# Patient Record
Sex: Female | Born: 2000 | Race: White | Hispanic: No | Marital: Single | Smoking: Never smoker
Health system: Southern US, Community
[De-identification: ages and names within clinical notes are randomized; demographics above are authoritative.]

---
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
## 2019-07-07 ENCOUNTER — Ambulatory Visit: Payer: Self-pay | Attending: Internal Medicine

## 2019-07-07 DIAGNOSIS — Z23 Encounter for immunization: Secondary | ICD-10-CM

## 2019-07-07 NOTE — Progress Notes (Signed)
   Covid-19 Vaccination Clinic  Name:  Monica Dodson    MRN: [id number] DOB: 2000-10-13  07/07/2019  [name] was observed post Covid-19 immunization for 15 minutes without incident. She was provided with Vaccine Information Sheet and instruction to access the V-Safe system.   [name] was instructed to call 911 with any severe reactions post vaccine: Marland Kitchen Difficulty breathing  . Swelling of face and throat  . A fast heartbeat  . A bad rash all over body  . Dizziness and weakness   Immunizations Administered    Name Date Dose VIS Date Route   Pfizer COVID-19 Vaccine 07/07/2019 11:41 AM 0.3 mL 04/06/2019 Intramuscular   Manufacturer: [employer], [employer]   Lot: [id number]   NDC: [address]-5

## 2019-08-01 ENCOUNTER — Ambulatory Visit: Payer: Self-pay | Attending: Internal Medicine

## 2019-08-01 DIAGNOSIS — Z23 Encounter for immunization: Secondary | ICD-10-CM

## 2019-08-01 NOTE — Progress Notes (Signed)
   Covid-19 Vaccination Clinic  Name:  LARESHA BACORN    MRN: [id number] DOB: [birth year]  08/01/2019  Ms. Gregg was observed post Covid-19 immunization for 15 minutes without incident. She was provided with Vaccine Information Sheet and instruction to access the V-Safe system.   Ms. Gorelick was instructed to call 911 with any severe reactions post vaccine: Marland Kitchen Difficulty breathing  . Swelling of face and throat  . A fast heartbeat  . A bad rash all over body  . Dizziness and weakness   Immunizations Administered    Name Date Dose VIS Date Route   Pfizer COVID-19 Vaccine 08/01/2019 10:41 AM 0.3 mL 04/06/2019 Intramuscular   Manufacturer: [employer], [employer]   Lot: [id number]   NDC: [address]-5

## 2023-08-22 ENCOUNTER — Ambulatory Visit: Payer: Self-pay | Admitting: Internal Medicine

## 2023-08-22 VITALS — BP 112/68 | Ht 65.0 in | Wt 143.6 lb

## 2023-08-22 DIAGNOSIS — Z1159 Encounter for screening for other viral diseases: Secondary | ICD-10-CM | POA: Diagnosis not present

## 2023-08-22 DIAGNOSIS — Z136 Encounter for screening for cardiovascular disorders: Secondary | ICD-10-CM

## 2023-08-22 DIAGNOSIS — R739 Hyperglycemia, unspecified: Secondary | ICD-10-CM

## 2023-08-22 DIAGNOSIS — Z0000 Encounter for general adult medical examination without abnormal findings: Secondary | ICD-10-CM | POA: Diagnosis not present

## 2023-08-22 DIAGNOSIS — Z114 Encounter for screening for human immunodeficiency virus [HIV]: Secondary | ICD-10-CM

## 2023-08-22 NOTE — Patient Instructions (Signed)

## 2023-08-22 NOTE — Progress Notes (Signed)
 Subjective:    Patient ID: Monica Dodson, female    DOB: 2000/10/13, 23 y.o.   MRN: [id number]  HPI  Patient presents to clinic today to establish care.  She would like her annual exam today.  Flu: 2023 Tetanus: > 10 years ago Covid: x 2  Pap smear: never Dentist: as needed  Diet: She does eat meat. She consumes fruits and veggies. She does eat fried foods. She drinks mostly water, coffee. Exercise: Walking  Review of Systems   History reviewed. No pertinent past medical history.  No current outpatient medications on file.   No current facility-administered medications for this visit.    No Known Allergies  History reviewed. No pertinent family history.  Social History   Socioeconomic History   Marital status: Single    Spouse name: Not on file   Number of children: Not on file   Years of education: Not on file   Highest education level: Bachelor's degree (e.g., BA, AB, BS)  Occupational History   Not on file  Tobacco Use   Smoking status: Never   Smokeless tobacco: Never  Vaping Use   Vaping status: Never Used  Substance and Sexual Activity   Alcohol use: Yes    Comment: social drinker   Drug use: Not Currently    Types: Marijuana   Sexual activity: Yes    Birth control/protection: Condom  Other Topics Concern   Not on file  Social History Narrative   Not on file   Social Drivers of Health   Financial Resource Strain: Low Risk  (08/22/2023)   Overall Financial Resource Strain (CARDIA)    Difficulty of Paying Living Expenses: Not very hard  Food Insecurity: No Food Insecurity (08/22/2023)   Hunger Vital Sign    Worried About Running Out of Food in the Last Year: Never true    Ran Out of Food in the Last Year: Never true  Transportation Needs: No Transportation Needs (08/22/2023)   PRAPARE - Administrator, Civil Service (Medical): No    Lack of Transportation (Non-Medical): No  Physical Activity: Sufficiently Active (08/22/2023)    Exercise Vital Sign    Days of Exercise per Week: 7 days    Minutes of Exercise per Session: 70 min  Stress: Stress Concern Present (08/22/2023)   [employer] of Occupational Health - Occupational Stress Questionnaire    Feeling of Stress : To some extent  Social Connections: Moderately Isolated (08/22/2023)   Social Connection and Isolation Panel [NHANES]    Frequency of Communication with Friends and Family: Twice a week    Frequency of Social Gatherings with Friends and Family: Twice a week    Attends Religious Services: Never    Database administrator or Organizations: Yes    Attends Engineer, structural: More than 4 times per year    Marital Status: Never married  Intimate Partner Violence: Not on file     Constitutional: Denies fever, malaise, fatigue, headache or abrupt weight changes.  HEENT: Denies eye pain, eye redness, ear pain, ringing in the ears, wax buildup, runny nose, nasal congestion, bloody nose, or sore throat. Respiratory: Denies difficulty breathing, shortness of breath, cough or sputum production.   Cardiovascular: Denies chest pain, chest tightness, palpitations or swelling in the hands or feet.  Gastrointestinal: Denies abdominal pain, bloating, constipation, diarrhea or blood in the stool.  GU: Denies urgency, frequency, pain with urination, burning sensation, blood in urine, odor or discharge. Musculoskeletal: Denies decrease  in range of motion, difficulty with gait, muscle pain or joint pain and swelling.  Skin: Denies redness, rashes, lesions or ulcercations.  Neurological: Denies dizziness, difficulty with memory, difficulty with speech or problems with balance and coordination.  Psych: Denies anxiety, depression, SI/HI.  No other specific complaints in a complete review of systems (except as listed in HPI above).      Objective:   Physical Exam  BP 112/68 (BP Location: Left Arm, Patient Position: Sitting, Cuff Size: Normal)   Ht 5\' 5"   (1.651 m)   Wt 143 lb 9.6 oz (65.1 kg)   LMP 08/22/2023 (Exact Date)   BMI 23.90 kg/m  Wt Readings from Last 3 Encounters:  08/22/23 143 lb 9.6 oz (65.1 kg)    General: Appears her stated age, well developed, well nourished in NAD. Skin: Warm, dry and intact. No rashes, lesions or ulcerations noted. HEENT: Head: normal shape and size; Eyes: sclera white, no icterus, conjunctiva pink, PERRLA and EOMs intact;  Neck:  Neck supple, trachea midline. No masses, lumps or thyromegaly present.  Cardiovascular: Normal rate and rhythm. S1,S2 noted.  No murmur, rubs or gallops noted. No JVD or BLE edema. Pulmonary/Chest: Normal effort and positive vesicular breath sounds. No respiratory distress. No wheezes, rales or ronchi noted.  Abdomen: Soft and nontender. Normal bowel sounds.  Musculoskeletal: Strength 5/5 BUE/BLE.  No difficulty with gait.  Neurological: Alert and oriented. Cranial nerves II-XII grossly intact. Coordination normal.  Psychiatric: Mood and affect normal. Behavior is normal. Judgment and thought content normal.        Assessment & Plan:  Preventative health maintenance:  Encouraged her to get a flu shot in the fall We will give her her tetanus vaccine at her appointment for her Pap smear Encouraged her to get her COVID booster She declines Pap smear today as she is currently on her menstrual cycle Encouraged her to consume a balanced diet and exercise regimen Advised her to see an eye doctor and dentist annually We will check CBC, c-Met, lipid, A1c, HIV and hep C today  Schedule an appointment for your Pap smear Helayne Lo, NP

## 2023-08-23 ENCOUNTER — Encounter: Payer: Self-pay | Admitting: Internal Medicine

## 2023-08-23 LAB — CBC
HCT: 37.6 % (ref 35.0–45.0)
Hemoglobin: 12.3 g/dL (ref 11.7–15.5)
MCH: 29.9 pg (ref 27.0–33.0)
MCHC: 32.7 g/dL (ref 32.0–36.0)
MCV: 91.5 fL (ref 80.0–100.0)
MPV: 11.4 fL (ref 7.5–12.5)
Platelets: 295 10*3/uL (ref 140–400)
RBC: 4.11 10*6/uL (ref 3.80–5.10)
RDW: 11.8 % (ref 11.0–15.0)
WBC: 5.1 10*3/uL (ref 3.8–10.8)

## 2023-08-23 LAB — LIPID PANEL
Cholesterol: 207 mg/dL — ABNORMAL HIGH (ref <200)
HDL: 83 mg/dL (ref >=50)
LDL Cholesterol (Calc): 110 mg/dL — ABNORMAL HIGH
Non-HDL Cholesterol (Calc): 124 mg/dL (ref <130)
Total CHOL/HDL Ratio: 2.5 (calc) (ref <5.0)
Triglycerides: 48 mg/dL (ref <150)

## 2023-08-23 LAB — COMPREHENSIVE METABOLIC PANEL WITH GFR
AG Ratio: 1.7 (calc) (ref 1.0–2.5)
ALT: 25 U/L (ref 6–29)
AST: 27 U/L (ref 10–30)
Albumin: 4.3 g/dL (ref 3.6–5.1)
Alkaline phosphatase (APISO): 49 U/L (ref 31–125)
BUN: 15 mg/dL (ref 7–25)
CO2: 30 mmol/L (ref 20–32)
Calcium: 9.2 mg/dL (ref 8.6–10.2)
Chloride: 104 mmol/L (ref 98–110)
Creat: 0.69 mg/dL (ref 0.50–0.96)
Globulin: 2.6 g/dL (ref 1.9–3.7)
Glucose, Bld: 80 mg/dL (ref 65–99)
Potassium: 4.4 mmol/L (ref 3.5–5.3)
Sodium: 140 mmol/L (ref 135–146)
Total Bilirubin: 0.4 mg/dL (ref 0.2–1.2)
Total Protein: 6.9 g/dL (ref 6.1–8.1)
eGFR: 125 mL/min/{1.73_m2} (ref >=60)

## 2023-08-23 LAB — HEMOGLOBIN A1C
Hgb A1c MFr Bld: 5.4 % (ref <5.7)
Mean Plasma Glucose: 108 mg/dL
eAG (mmol/L): 6 mmol/L

## 2023-08-23 LAB — HIV ANTIBODY (ROUTINE TESTING W REFLEX): HIV 1&2 Ab, 4th Generation: NONREACTIVE

## 2023-08-23 LAB — HEPATITIS C ANTIBODY: Hepatitis C Ab: NONREACTIVE

## 2023-08-31 ENCOUNTER — Other Ambulatory Visit (HOSPITAL_COMMUNITY)
Admission: RE | Admit: 2023-08-31 | Discharge: 2023-08-31 | Disposition: A | Discharge status: Home / Self Care | Source: Ambulatory Visit | Attending: Internal Medicine | Admitting: Internal Medicine

## 2023-08-31 ENCOUNTER — Ambulatory Visit: Admitting: Internal Medicine

## 2023-08-31 ENCOUNTER — Encounter: Payer: Self-pay | Admitting: Internal Medicine

## 2023-08-31 VITALS — BP 108/68 | Ht 65.0 in | Wt 143.6 lb

## 2023-08-31 DIAGNOSIS — Z113 Encounter for screening for infections with a predominantly sexual mode of transmission: Secondary | ICD-10-CM | POA: Insufficient documentation

## 2023-08-31 DIAGNOSIS — Z124 Encounter for screening for malignant neoplasm of cervix: Secondary | ICD-10-CM | POA: Insufficient documentation

## 2023-08-31 DIAGNOSIS — Z01419 Encounter for gynecological examination (general) (routine) without abnormal findings: Secondary | ICD-10-CM

## 2023-08-31 DIAGNOSIS — Z1151 Encounter for screening for human papillomavirus (HPV): Secondary | ICD-10-CM | POA: Insufficient documentation

## 2023-08-31 NOTE — Progress Notes (Signed)
 Subjective:    Patient ID: WAIVE MOUND, female    DOB: [birth year], 23 y.o.   MRN: [id number]  HPI  Patient presents to clinic today for her Pap smear.  She has never had a Pap smear prior.  She denies any vaginal complaints at this time.  She is sexually active and would like STD screening.  Review of Systems   No past medical history on file.  No current outpatient medications on file.   No current facility-administered medications for this visit.    No Known Allergies  Family History  Problem Relation Age of Onset   Hypertension Maternal Grandfather     Social History   Socioeconomic History   Marital status: Single    Spouse name: Not on file   Number of children: Not on file   Years of education: Not on file   Highest education level: Bachelor's degree (e.g., BA, AB, BS)  Occupational History   Not on file  Tobacco Use   Smoking status: Never   Smokeless tobacco: Never  Vaping Use   Vaping status: Never Used  Substance and Sexual Activity   Alcohol use: Yes    Comment: social drinker   Drug use: Not Currently    Types: Marijuana   Sexual activity: Yes    Birth control/protection: Condom  Other Topics Concern   Not on file  Social History Narrative   Not on file   Social Drivers of Health   Financial Resource Strain: Low Risk  (08/22/2023)   Overall Financial Resource Strain (CARDIA)    Difficulty of Paying Living Expenses: Not very hard  Food Insecurity: No Food Insecurity (08/22/2023)   Hunger Vital Sign    Worried About Running Out of Food in the Last Year: Never true    Ran Out of Food in the Last Year: Never true  Transportation Needs: No Transportation Needs (08/22/2023)   PRAPARE - Administrator, Civil Service (Medical): No    Lack of Transportation (Non-Medical): No  Physical Activity: Sufficiently Active (08/22/2023)   Exercise Vital Sign    Days of Exercise per Week: 7 days    Minutes of Exercise per Session: 70 min   Stress: Stress Concern Present (08/22/2023)   [employer] of Occupational Health - Occupational Stress Questionnaire    Feeling of Stress : To some extent  Social Connections: Moderately Isolated (08/22/2023)   Social Connection and Isolation Panel [NHANES]    Frequency of Communication with Friends and Family: Twice a week    Frequency of Social Gatherings with Friends and Family: Twice a week    Attends Religious Services: Never    Database administrator or Organizations: Yes    Attends Engineer, structural: More than 4 times per year    Marital Status: Never married  Intimate Partner Violence: Not on file     Constitutional: Denies fever, malaise, fatigue, headache or abrupt weight changes.  Respiratory: Denies difficulty breathing, shortness of breath, cough or sputum production.   Cardiovascular: Denies chest pain, chest tightness, palpitations or swelling in the hands or feet.  Gastrointestinal: Denies abdominal pain, bloating, constipation, diarrhea or blood in the stool.  GU: Denies urgency, frequency, pain with urination, burning sensation, blood in urine, odor or discharge. Skin: Denies redness, rashes, lesions or ulcercations.    No other specific complaints in a complete review of systems (except as listed in HPI above).      Objective:   Physical Exam  BP 108/68 (BP Location: Left Arm, Patient Position: Sitting, Cuff Size: Normal)   Ht 5\' 5"  (1.651 m)   Wt 143 lb 9.6 oz (65.1 kg)   LMP 08/22/2023 (Exact Date)   BMI 23.90 kg/m   Wt Readings from Last 3 Encounters:  08/22/23 143 lb 9.6 oz (65.1 kg)    General: Appears her stated age, well developed, well nourished in NAD. Skin: Warm, dry and intact. Cardiovascular: Normal rate and rhythm. S1,S2 noted.  No murmur, rubs or gallops noted. No JVD or BLE edema. No carotid bruits noted. Pulmonary/Chest: Normal effort and positive vesicular breath sounds. No respiratory distress. No wheezes, rales or  ronchi noted.  Abdomen: Soft and nontender.  Pelvic: Normal female anatomy.  Cervix without mass or lesion.  No CMT.  Adnexa nonpalpable. Neurological: Alert and oriented.   BMET    Component Value Date/Time   NA 140 08/22/2023 1031   K 4.4 08/22/2023 1031   CL 104 08/22/2023 1031   CO2 30 08/22/2023 1031   GLUCOSE 80 08/22/2023 1031   BUN 15 08/22/2023 1031   CREATININE 0.69 08/22/2023 1031   CALCIUM 9.2 08/22/2023 1031    Lipid Panel     Component Value Date/Time   CHOL 207 (H) 08/22/2023 1031   TRIG 48 08/22/2023 1031   HDL 83 08/22/2023 1031   CHOLHDL 2.5 08/22/2023 1031   LDLCALC 110 (H) 08/22/2023 1031    CBC    Component Value Date/Time   WBC 5.1 08/22/2023 1031   RBC 4.11 08/22/2023 1031   HGB 12.3 08/22/2023 1031   HCT 37.6 08/22/2023 1031   PLT 295 08/22/2023 1031   MCV 91.5 08/22/2023 1031   MCH 29.9 08/22/2023 1031   MCHC 32.7 08/22/2023 1031   RDW 11.8 08/22/2023 1031    Hgb A1C Lab Results  Component Value Date   HGBA1C 5.4 08/22/2023            Assessment & Plan:   Screening for cervical cancer with routine GYN exam:  Pap smear today with STD screening  RTC in 1 year for your annual exam Helayne Lo, NP

## 2023-08-31 NOTE — Patient Instructions (Signed)
Pap Test Why am I having this test? A Pap test, also called a Pap smear, is a screening test to check for signs of: Infection. Cancer of the cervix. The cervix is the lower part of the uterus that opens into the vagina. Changes that may be a sign that cancer is developing (precancerous changes). Women need this test on a regular basis. In general, you should have a Pap test every 3 years until you reach menopause or age 24. Women aged 30-60 may choose to have their Pap test done at the same time as an HPV (human papillomavirus) test every 5 years (instead of every 3 years). Your health care provider may recommend having Pap tests more or less often depending on your medical conditions and past Pap test results. What is being tested? Cervical cells are tested for signs of infection or abnormalities. What kind of sample is taken?  Your health care provider will collect a sample of cells from the surface of your cervix. This will be done using a small cotton swab, plastic spatula, or brush that is inserted into your vagina using a tool called a speculum. This sample is often collected during a pelvic exam, when you are lying on your back on an exam table with your feet in footrests (stirrups). In some cases, fluids (secretions) from the cervix or vagina may also be collected. How do I prepare for this test? Be aware of where you are in your menstrual cycle. If you are menstruating on the day of the test, you may be asked to reschedule. You may need to reschedule if you have a known vaginal infection on the day of the test. Follow instructions from your health care provider about: Changing or stopping your regular medicines. Some medicines can cause abnormal test results, such as vaginal medicines and tetracycline. Avoiding douching 2-3 days before or the day of the test. Tell a health care provider about: Any allergies you have. All medicines you are taking, including vitamins, herbs, eye drops,  creams, and over-the-counter medicines. Any bleeding problems you have. Any surgeries you have had. Any medical conditions you have. Whether you are pregnant or may be pregnant. How are the results reported? Your test results will be reported as either abnormal or normal. What do the results mean? A normal test result means that you do not have signs of cancer of the cervix. An abnormal result may mean that you have: Cancer. A Pap test by itself is not enough to diagnose cancer. You will have more tests done if cancer is suspected. Precancerous changes in your cervix. Inflammation of the cervix. An STI (sexually transmitted infection). A fungal infection. A parasite infection. Talk with your health care provider about what your results mean. In some cases, your health care provider may do more testing to confirm the results. Questions to ask your health care provider Ask your health care provider, or the department that is doing the test: When will my results be ready? How will I get my results? What are my treatment options? What other tests do I need? What are my next steps? Summary In general, women should have a Pap test every 3 years until they reach menopause or age 24. Your health care provider will collect a sample of cells from the surface of your cervix. This will be done using a small cotton swab, plastic spatula, or brush. In some cases, fluids (secretions) from the cervix or vagina may also be collected. This information is not   intended to replace advice given to you by your health care provider. Make sure you discuss any questions you have with your health care provider. Document Revised: 07/11/2020 Document Reviewed: 07/11/2020 Elsevier Patient Education  2024 Elsevier Inc.  

## 2023-09-01 ENCOUNTER — Other Ambulatory Visit: Payer: Self-pay

## 2023-09-01 DIAGNOSIS — Z021 Encounter for pre-employment examination: Secondary | ICD-10-CM

## 2023-09-01 NOTE — Progress Notes (Signed)
Pt cleared pre-employment UDS. 

## 2023-09-05 ENCOUNTER — Encounter: Payer: Self-pay | Admitting: Internal Medicine

## 2023-09-05 DIAGNOSIS — R87612 Low grade squamous intraepithelial lesion on cytologic smear of cervix (LGSIL): Secondary | ICD-10-CM

## 2023-09-05 LAB — CYTOLOGY - PAP
Chlamydia: NEGATIVE
Comment: NEGATIVE
Comment: NEGATIVE
Comment: NEGATIVE
Comment: NEGATIVE
Comment: NEGATIVE
Comment: NORMAL
HPV 16: NEGATIVE
HPV 18 / 45: NEGATIVE
High risk HPV: POSITIVE — AB
Neisseria Gonorrhea: NEGATIVE
Trichomonas: NEGATIVE

## 2023-10-10 NOTE — Progress Notes (Unsigned)
    NURSE VISIT NOTE  Subjective:    Patient ID: SHONTAY WALLNER, female    DOB: 2001/01/04, 23 y.o.   MRN: [id number]  HPI  Patient is a 23 y.o. single G0 who presents for her first Gardasil injection. Order to administer given by Stephane Ee, MD on 10/11/2023. She had a LGSIL pap last month. This was her first pap.   Objective:    Ht 5' 5 (1.651 m)   BMI 23.90 kg/m   23 y.o. LMP:  09/27/2023  Contraception:  Condoms Given by: Doneen Fuelling, CMA Site:  left deltoid  Lab Review  No results found for any visits on 10/11/23.    Assessment:   1. 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     Doneen Fuelling, CMA [facility]

## 2023-10-10 NOTE — Patient Instructions (Incomplete)
 HPV (Human Papillomavirus) Vaccine: What You Need to Know Many vaccine information statements are available in Spanish and other languages. See PromoAge.com.br. 1. Why get vaccinated? HPV (human papillomavirus) vaccine can prevent infection with some types of human papillomavirus. HPV infections can cause certain types of cancers, including: cervical, vaginal, and vulvar cancers in women penile cancer in men anal cancers in both men and women cancers of tonsils, base of tongue, and back of throat (oropharyngeal cancer) in both men and women HPV infections can also cause anogenital warts. HPV vaccine can prevent over 90% of cancers caused by HPV. HPV is spread through intimate skin-to-skin or sexual contact. HPV infections are so common that nearly all people will get at least one type of HPV at some time in their lives. Most HPV infections go away on their own within 2 years. But sometimes HPV infections will last longer and can cause cancers later in life. 2. HPV vaccine HPV vaccine is routinely recommended for adolescents at 9 or 24 years of age to ensure they are protected before they are exposed to the virus. HPV vaccine may be given beginning at age 66 years and vaccination is recommended for everyone through 24 years of age. HPV vaccine may be given to adults 27 through 24 years of age, based on discussions between the patient and health care provider. Most children who get the first dose before 69 years of age need 2 doses of HPV vaccine. People who get the first dose at or after 52 years of age and younger people with certain immunocompromising conditions need 3 doses. Your health care provider can give you more information. HPV vaccine may be given at the same time as other vaccines. 3. Talk with your health care provider Tell your vaccination provider if the person getting the vaccine: Has had an allergic reaction after a previous dose of HPV vaccine, or has any severe,  life-threatening allergies Is pregnant--HPV vaccine is not recommended until after pregnancy In some cases, your health care provider may decide to postpone HPV vaccination until a future visit. People with minor illnesses, such as a cold, may be vaccinated. People who are moderately or severely ill should usually wait until they recover before getting HPV vaccine. Your health care provider can give you more information. 4. Risks of a vaccine reaction Soreness, redness, or swelling where the shot is given can happen after HPV vaccination. Fever or headache can happen after HPV vaccination. People sometimes faint after medical procedures, including vaccination. Tell your provider if you feel dizzy or have vision changes or ringing in the ears. As with any medicine, there is a very remote chance of a vaccine causing a severe allergic reaction, other serious injury, or death. 5. What if there is a serious problem? An allergic reaction could occur after the vaccinated person leaves the clinic. If you see signs of a severe allergic reaction (hives, swelling of the face and throat, difficulty breathing, a fast heartbeat, dizziness, or weakness), call 9-1-1 and get the person to the nearest hospital. For other signs that concern you, call your health care provider. Adverse reactions should be reported to the Vaccine Adverse Event Reporting System (VAERS). Your health care provider will usually file this report, or you can do it yourself. Visit the VAERS website at www.vaers.LAgents.no or call (769)345-5413. VAERS is only for reporting reactions, and VAERS staff members do not give medical advice. 6. The National Vaccine Injury Compensation Program The [employer] Vaccine Injury Compensation Program (VICP) is a  federal program that was created to compensate people who may have been injured by certain vaccines. Claims regarding alleged injury or death due to vaccination have a time limit for filing, which may be as  short as two years. Visit the VICP website at SpiritualWord.at or call 905-576-8182 to learn about the program and about filing a claim. 7. How can I learn more? Ask your health care provider. Call your local or state health department. Visit the website of the Food and Drug Administration (FDA) for vaccine package inserts and additional information at FinderList.no. Contact the Centers for Disease Control and Prevention (CDC): Call 636 880 9869 (1-800-CDC-INFO) or Visit CDC's website at PicCapture.uy. Source: CDC Vaccine Information Statement HPV Vaccine (11/30/2019) This same material is available at FootballExhibition.com.br for no charge. This information is not intended to replace advice given to you by your health care provider. Make sure you discuss any questions you have with your health care provider. Document Revised: 07/28/2022 Document Reviewed: 05/03/2022 Elsevier Patient Education  2024 Elsevier Inc.  Please follow this link to schedule an appointment to establish care with a PCP: [email or web address]

## 2023-10-11 ENCOUNTER — Encounter: Payer: Self-pay | Admitting: Obstetrics & Gynecology

## 2023-10-11 ENCOUNTER — Ambulatory Visit: Admitting: Obstetrics & Gynecology

## 2023-10-11 ENCOUNTER — Telehealth: Payer: Self-pay | Admitting: Obstetrics & Gynecology

## 2023-10-11 VITALS — BP 116/60 | HR 55 | Ht 65.0 in | Wt 145.3 lb

## 2023-10-11 DIAGNOSIS — R87612 Low grade squamous intraepithelial lesion on cytologic smear of cervix (LGSIL): Secondary | ICD-10-CM

## 2023-10-11 DIAGNOSIS — Z23 Encounter for immunization: Secondary | ICD-10-CM

## 2023-10-11 DIAGNOSIS — B977 Papillomavirus as the cause of diseases classified elsewhere: Secondary | ICD-10-CM

## 2023-10-11 NOTE — Telephone Encounter (Signed)
 Reached out to pt bc pt needs to schedule the Gardisil vaccine in 2 months.  Left message for pt to call back to schedule.
